# Patient Record
Sex: Male | Born: 1990 | Race: Black or African American | Hispanic: No | Marital: Married | Smoking: Never smoker
Health system: Southern US, Community
[De-identification: ages and names within clinical notes are randomized; demographics above are authoritative.]

## PROBLEM LIST (undated history)

## (undated) HISTORY — PX: NO PAST SURGERIES: SHX2092

---
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
## 2010-07-05 IMAGING — CR DG FOOT COMPLETE 3+V*L*
3 series · 3 of 3 positions shown · non-contrast
Comparison: No priors

CLINICAL DATA: Injured the fifth toe of several days ago

LEFT FOOT - COMPLETE 3+ VIEW

[t foot ap left]
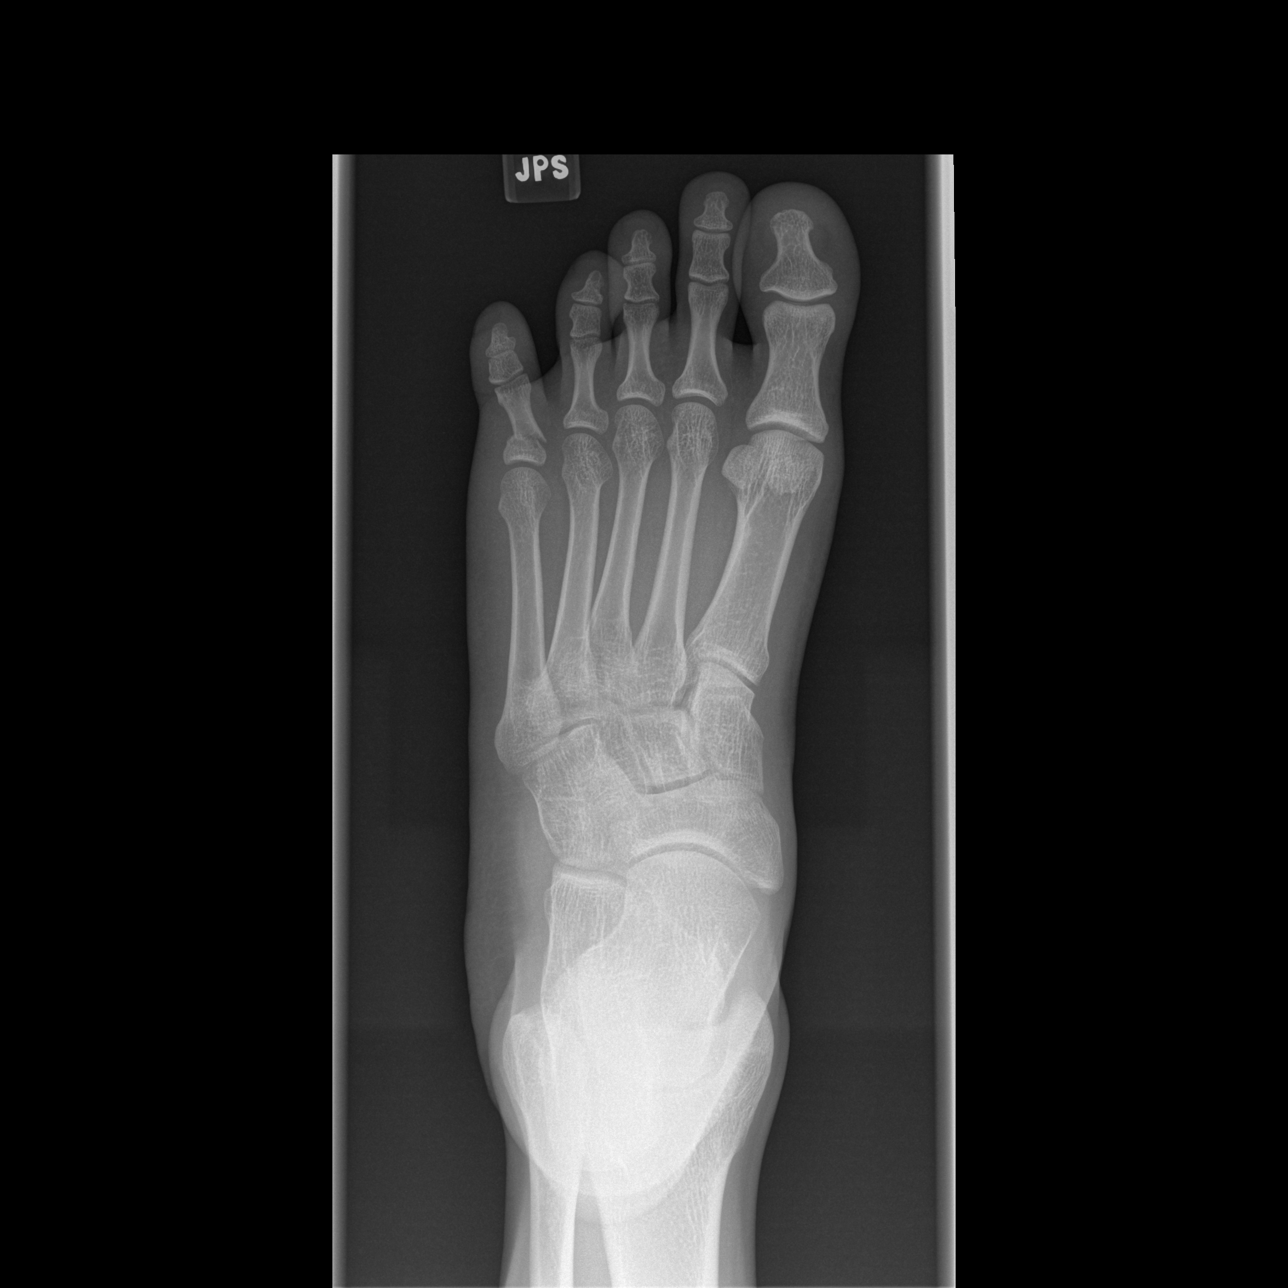

[t foot oblique left]
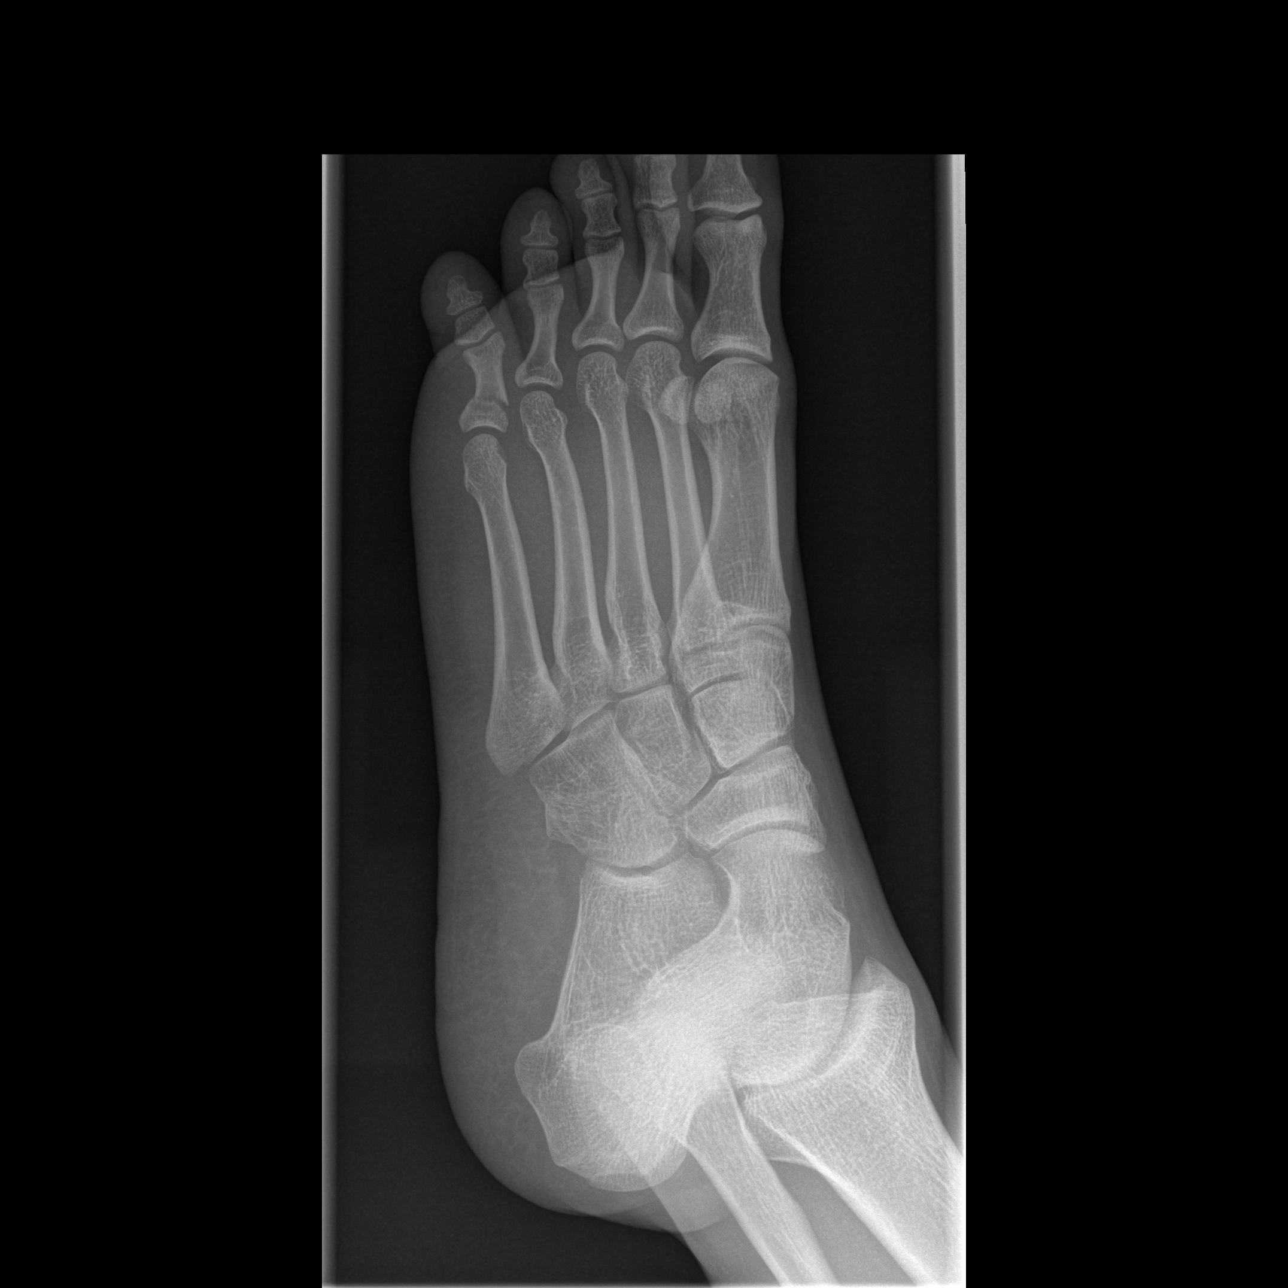

[t foot lat left]
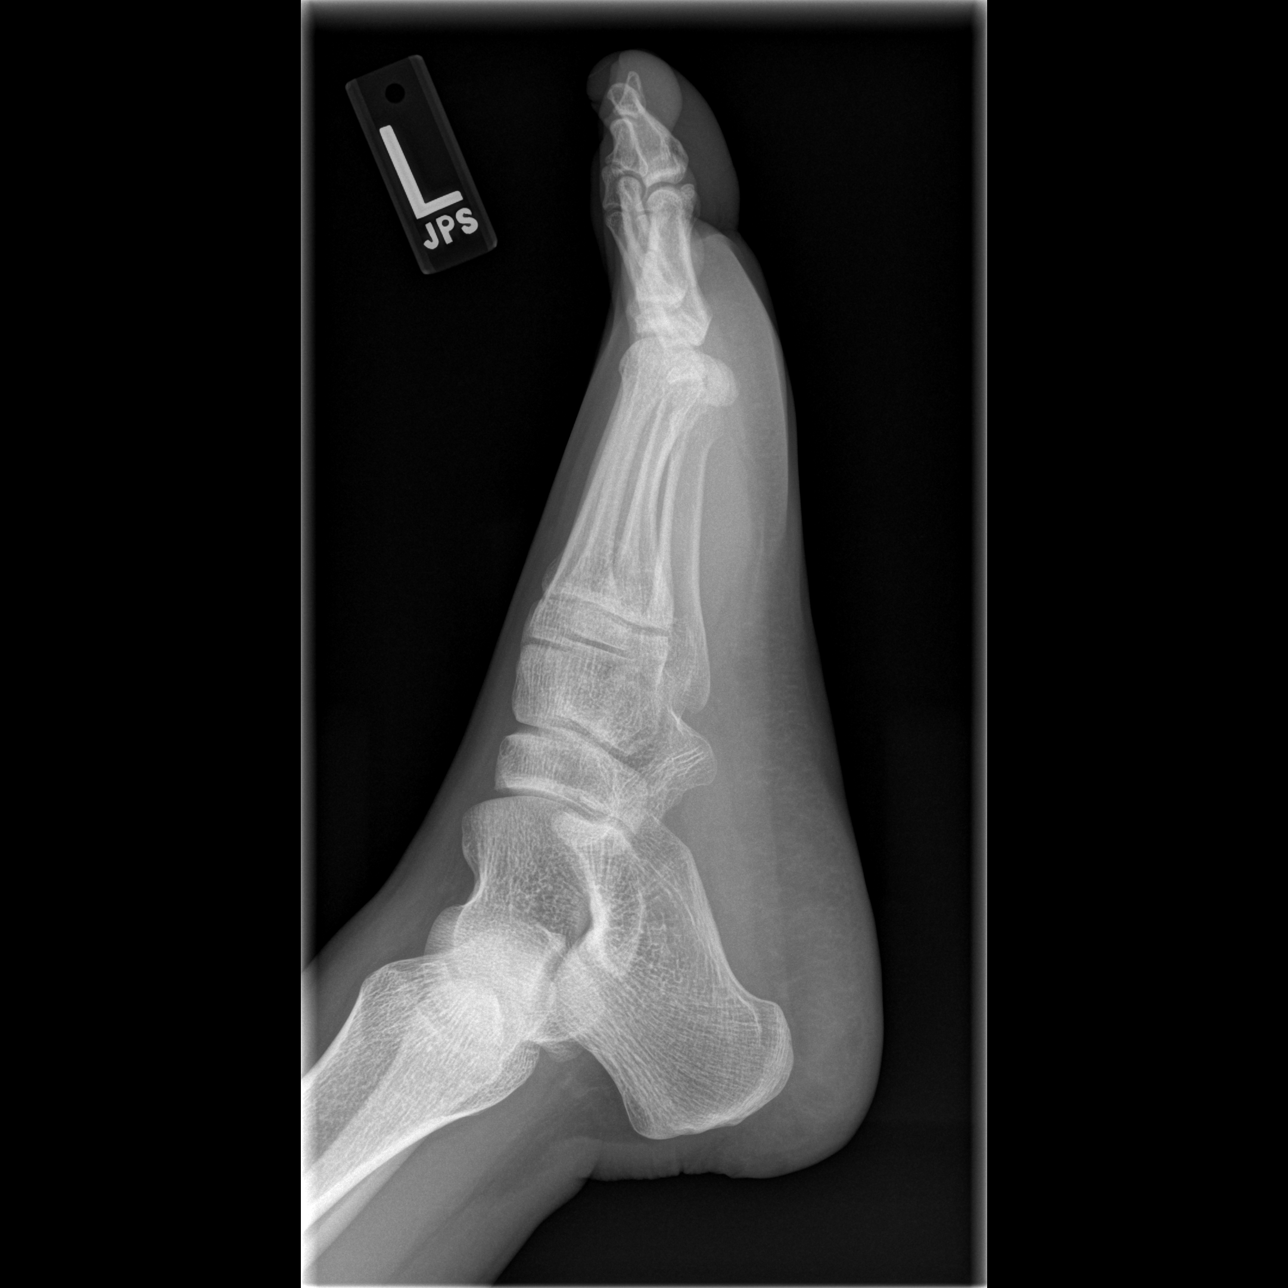

[3 of 3 positions shown; findings below may reference images not displayed]

FINDINGS: There is a mildly angulated transverse fracture through
the base of the proximal phalanx of the fifth digit.
IMPRESSION: Fracture of the fifth digit as above.

## 2015-02-10 ENCOUNTER — Telehealth: Payer: Self-pay

## 2015-02-15 NOTE — Telephone Encounter (Signed)
No msg °

## 2015-03-18 ENCOUNTER — Encounter (HOSPITAL_COMMUNITY): Payer: Self-pay | Admitting: *Deleted

## 2015-03-18 ENCOUNTER — Emergency Department (HOSPITAL_COMMUNITY)
Admission: EM | Admit: 2015-03-18 | Discharge: 2015-03-18 | Disposition: A | Discharge status: Home / Self Care | Payer: Self-pay | Attending: Emergency Medicine | Admitting: Emergency Medicine

## 2015-03-18 DIAGNOSIS — B9789 Other viral agents as the cause of diseases classified elsewhere: Secondary | ICD-10-CM

## 2015-03-18 DIAGNOSIS — J069 Acute upper respiratory infection, unspecified: Secondary | ICD-10-CM | POA: Insufficient documentation

## 2015-03-18 DIAGNOSIS — J988 Other specified respiratory disorders: Secondary | ICD-10-CM

## 2015-03-18 MED ORDER — IBUPROFEN 800 MG PO TABS: 800.0000 mg | ORAL_TABLET | Freq: Three times a day (TID) | ORAL | Status: DC | PRN

## 2015-03-18 MED ORDER — ALBUTEROL SULFATE HFA 108 (90 BASE) MCG/ACT IN AERS
2.0000 | INHALATION_SPRAY | Freq: Once | RESPIRATORY_TRACT | Status: AC
  Administered 2015-03-18: 2 via RESPIRATORY_TRACT
  Filled 2015-03-18: qty 6.7

## 2015-03-18 MED ORDER — ACETAMINOPHEN 500 MG PO TABS
1000.0000 mg | ORAL_TABLET | Freq: Once | ORAL | Status: AC
  Administered 2015-03-18: 1000 mg via ORAL
  Filled 2015-03-18: qty 2

## 2015-03-18 MED ORDER — HYDROCOD POLST-CHLORPHEN POLST 10-8 MG/5ML PO LQCR: 5.0000 mL | Freq: Two times a day (BID) | ORAL | Status: DC | PRN

## 2015-03-18 NOTE — ED Notes (Signed)
Pt reports he has had HA, general aches and chills for one week.

## 2015-03-18 NOTE — Discharge Instructions (Signed)
Read the information below.  Use the prescribed medication as directed.  Please discuss all new medications with your pharmacist.  You may return to the Emergency Department at any time for worsening condition or any new symptoms that concern you.    If you develop high fevers that do not resolve with tylenol or ibuprofen, you have difficulty swallowing or breathing, or you are unable to tolerate fluids by mouth, return to the ER for a recheck.    ° ° ° °Viral Infections °A viral infection can be caused by different types of viruses. Most viral infections are not serious and resolve on their own. However, some infections may cause severe symptoms and may lead to further complications. °SYMPTOMS °Viruses can frequently cause: °· Minor sore throat. °· Aches and pains. °· Headaches. °· Runny nose. °· Different types of rashes. °· Watery eyes. °· Tiredness. °· Cough. °· Loss of appetite. °· Gastrointestinal infections, resulting in nausea, vomiting, and diarrhea. °These symptoms do not respond to antibiotics because the infection is not caused by bacteria. However, you might catch a bacterial infection following the viral infection. This is sometimes called a "superinfection." Symptoms of such a bacterial infection may include: °· Worsening sore throat with pus and difficulty swallowing. °· Swollen neck glands. °· Chills and a high or persistent fever. °· Severe headache. °· Tenderness over the sinuses. °· Persistent overall ill feeling (malaise), muscle aches, and tiredness (fatigue). °· Persistent cough. °· Yellow, green, or brown mucus production with coughing. °HOME CARE INSTRUCTIONS  °· Only take over-the-counter or prescription medicines for pain, discomfort, diarrhea, or fever as directed by your caregiver. °· Drink enough water and fluids to keep your urine clear or pale yellow. Sports drinks can provide valuable electrolytes, sugars, and hydration. °· Get plenty of rest and maintain proper nutrition. Soups and  broths with crackers or rice are fine. °SEEK IMMEDIATE MEDICAL CARE IF:  °· You have severe headaches, shortness of breath, chest pain, neck pain, or an unusual rash. °· You have uncontrolled vomiting, diarrhea, or you are unable to keep down fluids. °· You or your child has an oral temperature above 102° F (38.9° C), not controlled by medicine. °· Your baby is older than 3 months with a rectal temperature of 102° F (38.9° C) or higher. °· Your baby is 3 months old or younger with a rectal temperature of 100.4° F (38° C) or higher. °MAKE SURE YOU:  °· Understand these instructions. °· Will watch your condition. °· Will get help right away if you are not doing well or get worse. °Document Released: 09/12/2005 Document Revised: 02/25/2012 Document Reviewed: 04/09/2011 °[employer]® Patient Information ©2015 [employer]. This information is not intended to replace advice given to you by your health care provider. Make sure you discuss any questions you have with your health care provider. ° °

## 2015-03-18 NOTE — ED Provider Notes (Signed)
CSN: [id number]     Arrival date & time 03/18/15  0806 History   First MD Initiated Contact with Patient 03/18/15 0809     Chief Complaint  Patient presents with  . Influenza     (Consider location/radiation/quality/duration/timing/severity/associated sxs/prior Treatment) The history is provided by the patient.     Patient presents with one weeks of chills, sweats, myalgias, headache, dry cough, mild sore throat, nasal congestion/rhinorrhea.  Has taken alka selzer cold and sinus without improvement.  Denies SOB.    History reviewed. No pertinent past medical history. History reviewed. No pertinent past surgical history. History reviewed. No pertinent family history. History  Substance Use Topics  . Smoking status: Never Smoker   . Smokeless tobacco: Never Used  . Alcohol Use: Yes     Comment: social    Review of Systems  All other systems reviewed and are negative.     Allergies  Review of patient's allergies indicates not on file.  Home Medications   Prior to Admission medications   Not on File   BP 108/75 mmHg  Pulse 100  Temp(Src) 101.7 F (38.7 C) (Oral)  Resp 20  SpO2 100% Physical Exam  Constitutional: He appears well-developed and well-nourished. No distress.  HENT:  Head: Normocephalic and atraumatic.  Mouth/Throat: Uvula is midline. Mucous membranes are not dry. Posterior oropharyngeal erythema present. No oropharyngeal exudate, posterior oropharyngeal edema or tonsillar abscesses.  Eyes: Conjunctivae and EOM are normal.  Neck: Normal range of motion. Neck supple.  Cardiovascular: Normal rate and regular rhythm.   Pulmonary/Chest: Effort normal and breath sounds normal. No respiratory distress. He has no wheezes. He has no rales.  Abdominal: Soft. He exhibits no distension and no mass. There is no tenderness. There is no rebound and no guarding.  Lymphadenopathy:    He has no cervical adenopathy.  Neurological: He is alert. He exhibits normal muscle  tone.  Skin: He is not diaphoretic.  Nursing note and vitals reviewed.   ED Course  Procedures (including critical care time) Labs Review Labs Reviewed - No data to display  Imaging Review No results found.   EKG Interpretation None      MDM   Final diagnoses:  Viral respiratory illness    Afebrile, nontoxic patient with constellation of symptoms suggestive of viral syndrome.  No concerning findings on exam.  Discharged home with supportive care, PCP follow up.  Discussed result, findings, treatment, and follow up  with patient.  Pt given return precautions.  Pt verbalizes understanding and agrees with plan.        Trixie Dredgemily Jaxten Brosh, PA-C 03/18/15 1052  Gwyneth SproutWhitney Plunkett, MD 03/19/15 [phone]

## 2015-03-18 NOTE — ED Notes (Signed)
Declined W/C at D/C and was escorted to lobby by RN. 

## 2020-12-30 ENCOUNTER — Other Ambulatory Visit: Payer: 59

## 2020-12-30 DIAGNOSIS — Z20822 Contact with and (suspected) exposure to covid-19: Secondary | ICD-10-CM

## 2021-01-01 LAB — NOVEL CORONAVIRUS, NAA: SARS-CoV-2, NAA: NOT DETECTED

## 2021-01-01 LAB — SARS-COV-2, NAA 2 DAY TAT

## 2022-07-25 ENCOUNTER — Ambulatory Visit (INDEPENDENT_AMBULATORY_CARE_PROVIDER_SITE_OTHER): Payer: 59 | Admitting: Nurse Practitioner

## 2022-07-25 ENCOUNTER — Encounter: Payer: Self-pay | Admitting: Nurse Practitioner

## 2022-07-25 VITALS — BP 110/76 | HR 91 | Temp 96.6°F | Ht 65.5 in | Wt 138.6 lb

## 2022-07-25 DIAGNOSIS — R202 Paresthesia of skin: Secondary | ICD-10-CM

## 2022-07-25 DIAGNOSIS — K219 Gastro-esophageal reflux disease without esophagitis: Secondary | ICD-10-CM | POA: Diagnosis not present

## 2022-07-25 DIAGNOSIS — Z13220 Encounter for screening for lipoid disorders: Secondary | ICD-10-CM | POA: Diagnosis not present

## 2022-07-25 DIAGNOSIS — R42 Dizziness and giddiness: Secondary | ICD-10-CM

## 2022-07-25 LAB — TSH: TSH: 1.1 u[IU]/mL (ref 0.35–5.50)

## 2022-07-25 LAB — CBC WITH DIFFERENTIAL/PLATELET
Basophils Absolute: 0 10*3/uL (ref 0.0–0.1)
Basophils Relative: 1 % (ref 0.0–3.0)
Eosinophils Absolute: 0.3 10*3/uL (ref 0.0–0.7)
Eosinophils Relative: 7.5 % — ABNORMAL HIGH (ref 0.0–5.0)
HCT: 43 % (ref 39.0–52.0)
Hemoglobin: 14.3 g/dL (ref 13.0–17.0)
Lymphocytes Relative: 34.5 % (ref 12.0–46.0)
Lymphs Abs: 1.2 10*3/uL (ref 0.7–4.0)
MCHC: 33.3 g/dL (ref 30.0–36.0)
MCV: 91.3 fl (ref 78.0–100.0)
Monocytes Absolute: 0.3 10*3/uL (ref 0.1–1.0)
Monocytes Relative: 8.4 % (ref 3.0–12.0)
Neutro Abs: 1.6 10*3/uL (ref 1.4–7.7)
Neutrophils Relative %: 48.6 % (ref 43.0–77.0)
Platelets: 218 10*3/uL (ref 150.0–400.0)
RBC: 4.72 Mil/uL (ref 4.22–5.81)
RDW: 13.6 % (ref 11.5–15.5)
WBC: 3.4 10*3/uL — ABNORMAL LOW (ref 4.0–10.5)

## 2022-07-25 LAB — COMPREHENSIVE METABOLIC PANEL
ALT: 13 U/L (ref 0–53)
AST: 19 U/L (ref 0–37)
Albumin: 4.8 g/dL (ref 3.5–5.2)
Alkaline Phosphatase: 39 U/L (ref 39–117)
BUN: 12 mg/dL (ref 6–23)
CO2: 25 mEq/L (ref 19–32)
Calcium: 9.3 mg/dL (ref 8.4–10.5)
Chloride: 102 mEq/L (ref 96–112)
Creatinine, Ser: 1.03 mg/dL (ref 0.40–1.50)
GFR: 97.22 mL/min (ref >60.00)
Glucose, Bld: 87 mg/dL (ref 70–99)
Potassium: 3.8 mEq/L (ref 3.5–5.1)
Sodium: 137 mEq/L (ref 135–145)
Total Bilirubin: 0.5 mg/dL (ref 0.2–1.2)
Total Protein: 7.6 g/dL (ref 6.0–8.3)

## 2022-07-25 LAB — LIPID PANEL
Cholesterol: 148 mg/dL (ref 0–200)
HDL: 59.2 mg/dL (ref >39.00)
LDL Cholesterol: 76 mg/dL (ref 0–99)
NonHDL: 89.1
Total CHOL/HDL Ratio: 3
Triglycerides: 66 mg/dL (ref 0.0–149.0)
VLDL: 13.2 mg/dL (ref 0.0–40.0)

## 2022-07-25 LAB — VITAMIN B12: Vitamin B-12: 283 pg/mL (ref 211–911)

## 2022-07-25 LAB — HEMOGLOBIN A1C: Hgb A1c MFr Bld: 5.8 % (ref 4.6–6.5)

## 2022-07-25 MED ORDER — OMEPRAZOLE 20 MG PO CPDR: 20.0000 mg | DELAYED_RELEASE_CAPSULE | Freq: Every day | ORAL | 1 refills | Status: DC

## 2022-07-25 NOTE — Progress Notes (Signed)
New Patient Visit  BP 110/76   Pulse 91   Temp (!) 96.6 F (35.9 C) (Temporal)   Ht 5' 5.5" (1.664 m)   Wt 138 lb 9.6 oz (62.9 kg)   SpO2 98%   BMI 22.71 kg/m    Subjective:    Patient ID: Mitchell Vega, male    DOB: 20-Sep-1991, 30 y.o.   MRN: [id number]  CC: Chief Complaint  Patient presents with   Establish Care    Np. Est care. Pt c/o persistent irritated throat w/ heart burn for several days. Pt states he has episodes of feeling foggy, dizzy and having out-of-body experience    HPI: Mitchell Vega is a 31 y.o. male presents for new patient visit to establish care.  Introduced to Publishing rights manager role and practice setting.  All questions answered.  Discussed provider/patient relationship and expectations.  He had a sore throat in May after being exposed to strep. He went to a doctor and tested negative for strep. It went away. He then developed a sore throat a few weeks ago and tested negative for strep again and when was prescribed an antibiotic for a sinus infection. He states that this has improved some. He also does endorse acid reflux, especially when eating spicy foods or pasta.  Right now, his throat feels fine.   3 years ago, he was driving and noticed that his body started tingling that went away on it's own. About a week ago, he was driving and noticed that he felt dizzy. He stopped and sat down for a minute. When he was driving home, he noticed that he felt he had delayed reactions, confusion. When he got home, he laid down and it went away. He does report some increase stress and his grandfather passed a week ago.   History reviewed. No pertinent past medical history.  History reviewed. No pertinent surgical history.  Family History  Problem Relation Age of Onset   Depression Mother    Hypertension Father    Heart disease Paternal Grandmother    Heart disease Paternal Grandfather      Social History   Tobacco Use   Smoking status: Never    Smokeless tobacco: Never  Vaping Use   Vaping Use: Never used  Substance Use Topics   Alcohol use: Yes    Comment: social   Drug use: No    No current outpatient medications on file prior to visit.   No current facility-administered medications on file prior to visit.     Review of Systems  Constitutional:  Positive for fatigue. Negative for fever.  HENT: Negative.    Respiratory:  Positive for shortness of breath (when laying down. He sleeps on his side).   Cardiovascular: Negative.   Gastrointestinal:  Negative for constipation and diarrhea.       Heart burn  Genitourinary: Negative.   Musculoskeletal: Negative.   Skin: Negative.   Neurological:  Positive for dizziness. Negative for headaches.  Psychiatric/Behavioral: Negative.        Objective:    BP 110/76   Pulse 91   Temp (!) 96.6 F (35.9 C) (Temporal)   Ht 5' 5.5" (1.664 m)   Wt 138 lb 9.6 oz (62.9 kg)   SpO2 98%   BMI 22.71 kg/m   Wt Readings from Last 3 Encounters:  07/25/22 138 lb 9.6 oz (62.9 kg)    BP Readings from Last 3 Encounters:  07/25/22 110/76  03/18/15 113/67    Physical Exam  Vitals and nursing note reviewed.  Constitutional:      Appearance: Normal appearance.  HENT:     Head: Normocephalic.     Right Ear: Tympanic membrane, ear canal and external ear normal.     Left Ear: Tympanic membrane, ear canal and external ear normal.     Mouth/Throat:     Pharynx: Oropharynx is clear. No oropharyngeal exudate or posterior oropharyngeal erythema.  Eyes:     Conjunctiva/sclera: Conjunctivae normal.  Cardiovascular:     Rate and Rhythm: Normal rate and regular rhythm.     Pulses: Normal pulses.     Heart sounds: Normal heart sounds.  Pulmonary:     Effort: Pulmonary effort is normal.     Breath sounds: Normal breath sounds.  Abdominal:     Palpations: Abdomen is soft.     Tenderness: There is no abdominal tenderness.  Musculoskeletal:     Cervical back: Normal range of motion and neck  supple. No tenderness.  Lymphadenopathy:     Cervical: No cervical adenopathy.  Skin:    General: Skin is warm.  Neurological:     General: No focal deficit present.     Mental Status: He is alert and oriented to person, place, and time.  Psychiatric:        Mood and Affect: Mood normal.        Behavior: Behavior normal.        Thought Content: Thought content normal.        Judgment: Judgment normal.        Assessment & Plan:   Problem List Items Addressed This Visit       Digestive   Gastroesophageal reflux disease - Primary    Intermittent sore throat may be related to GERD. Discussed nutritional changes, limiting triggers. Start omeprazole 20mg  daily. Follow-up in 4 weeks.       Relevant Medications   omeprazole (PRILOSEC) 20 MG capsule   Other Visit Diagnoses     Dizzy       May be related to stress. Will check CMP, CBC, and TSH. Encouraged drinking plenty of fluids. F/U 4 weeks    Relevant Orders   CBC with Differential/Platelet (Completed)   Comprehensive metabolic panel (Completed)   TSH (Completed)   Tingling       Will check vitamin B12 levels today. Vital signs stable. No red flags on exam.    Relevant Orders   Vitamin B12 (Completed)   Hemoglobin A1c (Completed)   Screening, lipid       Screen baseline lipid panel today   Relevant Orders   Lipid panel (Completed)        Follow up plan: Return in about 4 weeks (around 08/22/2022) for CPE.

## 2022-07-25 NOTE — Assessment & Plan Note (Signed)
Intermittent sore throat may be related to GERD. Discussed nutritional changes, limiting triggers. Start omeprazole 20mg  daily. Follow-up in 4 weeks.

## 2022-07-25 NOTE — Patient Instructions (Signed)
It was great to see you!  We are checking your labs today and will let you know the results via mychart/phone.   Start omeprazole once a day for acid reflux. You can take this daily as needed or every day.   Let's follow-up in 4 weeks, sooner if you have concerns.  If a referral was placed today, you will be contacted for an appointment. Please note that routine referrals can sometimes take up to 3-4 weeks to process. Please call our office if you haven't heard anything after this time frame.  Take care,  Rodman Pickle, NP

## 2022-08-28 NOTE — Progress Notes (Unsigned)
There were no vitals taken for this visit.   Subjective:    Patient ID: Mitchell Vega, male    DOB: [birth year], 31 y.o.   MRN: [id number]  CC: No chief complaint on file.  HPI: Mitchell Vega is a 31 y.o. male presenting on 08/29/2022 for comprehensive medical examination. Current medical complaints include:{Blank single:19197::"none","***"}  He currently lives with: Interim Problems from his last visit: {Blank single:19197::"yes","no"}  Depression Screen done today and results listed below:     07/25/2022    1:26 PM  Depression screen PHQ 2/9  Decreased Interest 1  Down, Depressed, Hopeless 2  PHQ - 2 Score 3  Altered sleeping 2  Tired, decreased energy 3  Change in appetite 0  Feeling bad or failure about yourself  2  Trouble concentrating 1  Moving slowly or fidgety/restless 0  Suicidal thoughts 0  PHQ-9 Score 11  Difficult doing work/chores Somewhat difficult    The patient {has/does not have:19849} a history of falls. I {did/did not:19850} complete a risk assessment for falls. A plan of care for falls {was/was not:19852} documented.   Past Medical History:  No past medical history on file.  Surgical History:  No past surgical history on file.  Medications:  Current Outpatient Medications on File Prior to Visit  Medication Sig   omeprazole (PRILOSEC) 20 MG capsule Take 1 capsule (20 mg total) by mouth daily.   No current facility-administered medications on file prior to visit.    Allergies:  No Known Allergies  Social History:  Social History   Socioeconomic History   Marital status: Married    Spouse name: Not on file   Number of children: Not on file   Years of education: Not on file   Highest education level: Not on file  Occupational History   Not on file  Tobacco Use   Smoking status: Never   Smokeless tobacco: Never  Vaping Use   Vaping Use: Never used  Substance and Sexual Activity   Alcohol use: Yes    Comment: social   Drug  use: No   Sexual activity: Yes  Other Topics Concern   Not on file  Social History Narrative   Not on file   Social Determinants of Health   Financial Resource Strain: Not on file  Food Insecurity: Not on file  Transportation Needs: Not on file  Physical Activity: Not on file  Stress: Not on file  Social Connections: Not on file  Intimate Partner Violence: Not on file   Social History   Tobacco Use  Smoking Status Never  Smokeless Tobacco Never   Social History   Substance and Sexual Activity  Alcohol Use Yes   Comment: social    Family History:  Family History  Problem Relation Age of Onset   Depression Mother    Hypertension Father    Heart disease Paternal Grandmother    Heart disease Paternal Grandfather     Past medical history, surgical history, medications, allergies, family history and social history reviewed with patient today and changes made to appropriate areas of the chart.   ROS All other ROS negative except what is listed above and in the HPI.      Objective:    There were no vitals taken for this visit.  Wt Readings from Last 3 Encounters:  07/25/22 138 lb 9.6 oz (62.9 kg)    Physical Exam  Results for orders placed or performed in visit on 07/25/22  CBC with Differential/Platelet  Result Value Ref Range   WBC 3.4 (L) 4.0 - 10.5 K/uL   RBC 4.72 4.22 - 5.81 Mil/uL   Hemoglobin 14.3 13.0 - 17.0 g/dL   HCT 54.6 56.8 - 12.7 %   MCV 91.3 78.0 - 100.0 fl   MCHC 33.3 30.0 - 36.0 g/dL   RDW 51.7 00.1 - 74.9 %   Platelets 218.0 150.0 - 400.0 K/uL   Neutrophils Relative % 48.6 43.0 - 77.0 %   Lymphocytes Relative 34.5 12.0 - 46.0 %   Monocytes Relative 8.4 3.0 - 12.0 %   Eosinophils Relative 7.5 (H) 0.0 - 5.0 %   Basophils Relative 1.0 0.0 - 3.0 %   Neutro Abs 1.6 1.4 - 7.7 K/uL   Lymphs Abs 1.2 0.7 - 4.0 K/uL   Monocytes Absolute 0.3 0.1 - 1.0 K/uL   Eosinophils Absolute 0.3 0.0 - 0.7 K/uL   Basophils Absolute 0.0 0.0 - 0.1 K/uL   Comprehensive metabolic panel  Result Value Ref Range   Sodium 137 135 - 145 mEq/L   Potassium 3.8 3.5 - 5.1 mEq/L   Chloride 102 96 - 112 mEq/L   CO2 25 19 - 32 mEq/L   Glucose, Bld 87 70 - 99 mg/dL   BUN 12 6 - 23 mg/dL   Creatinine, Ser 4.49 0.40 - 1.50 mg/dL   Total Bilirubin 0.5 0.2 - 1.2 mg/dL   Alkaline Phosphatase 39 39 - 117 U/L   AST 19 0 - 37 U/L   ALT 13 0 - 53 U/L   Total Protein 7.6 6.0 - 8.3 g/dL   Albumin 4.8 3.5 - 5.2 g/dL   GFR 67.59 >16.38 mL/min   Calcium 9.3 8.4 - 10.5 mg/dL  Lipid panel  Result Value Ref Range   Cholesterol 148 0 - 200 mg/dL   Triglycerides 46.6 0.0 - 149.0 mg/dL   HDL 59.93 >57.01 mg/dL   VLDL 77.9 0.0 - 39.0 mg/dL   LDL Cholesterol 76 0 - 99 mg/dL   Total CHOL/HDL Ratio 3    NonHDL 89.10   TSH  Result Value Ref Range   TSH 1.10 0.35 - 5.50 uIU/mL  Vitamin B12  Result Value Ref Range   Vitamin B-12 283 211 - 911 pg/mL  Hemoglobin A1c  Result Value Ref Range   Hgb A1c MFr Bld 5.8 4.6 - 6.5 %      Assessment & Plan:   Problem List Items Addressed This Visit   None    Discussed aspirin prophylaxis for myocardial infarction prevention and decision was {Blank single:19197::"it was not indicated","made to continue ASA","made to start ASA","made to stop ASA","that we recommended ASA, and patient refused"}  LABORATORY TESTING:  Health maintenance labs ordered today as discussed above.   The natural history of prostate cancer and ongoing controversy regarding screening and potential treatment outcomes of prostate cancer has been discussed with the patient. The meaning of a false positive PSA and a false negative PSA has been discussed. He indicates understanding of the limitations of this screening test and wishes *** to proceed with screening PSA testing.   IMMUNIZATIONS:   - Tdap: Tetanus vaccination status reviewed: {tetanus status:315746}. - Influenza: {Blank single:19197::"Up to date","Administered today","Postponed to flu  season","Refused","Given elsewhere"} - Pneumovax: {Blank single:19197::"Up to date","Administered today","Not applicable","Refused","Given elsewhere"} - Prevnar: {Blank single:19197::"Up to date","Administered today","Not applicable","Refused","Given elsewhere"} - HPV: {Blank single:19197::"Up to date","Administered today","Not applicable","Refused","Given elsewhere"} - Zostavax vaccine: {Blank single:19197::"Up to date","Administered today","Not applicable","Refused","Given elsewhere"}  SCREENING: - Colonoscopy: {Blank single:19197::"Up to date","Ordered today","Not applicable","Refused","Done  elsewhere"}  Discussed with patient purpose of the colonoscopy is to detect colon cancer at curable precancerous or early stages   - AAA Screening: {Blank single:19197::"Up to date","Ordered today","Not applicable","Refused","Done elsewhere"}  -Hearing Test: {Blank single:19197::"Up to date","Ordered today","Not applicable","Refused","Done elsewhere"}  -Spirometry: {Blank single:19197::"Up to date","Ordered today","Not applicable","Refused","Done elsewhere"}   PATIENT COUNSELING:    Sexuality: Discussed sexually transmitted diseases, partner selection, use of condoms, avoidance of unintended pregnancy  and contraceptive alternatives.   Advised to avoid cigarette smoking.  I discussed with the patient that most people either abstain from alcohol or drink within safe limits (<=14/week and <=4 drinks/occasion for males, <=7/weeks and <= 3 drinks/occasion for females) and that the risk for alcohol disorders and other health effects rises proportionally with the number of drinks per week and how often a drinker exceeds daily limits.  Discussed cessation/primary prevention of drug use and availability of treatment for abuse.   Diet: Encouraged to adjust caloric intake to maintain  or achieve ideal body weight, to reduce intake of dietary saturated fat and total fat, to limit sodium intake by avoiding high  sodium foods and not adding table salt, and to maintain adequate dietary potassium and calcium preferably from fresh fruits, vegetables, and low-fat dairy products.    stressed the importance of regular exercise  Injury prevention: Discussed safety belts, safety helmets, smoke detector, smoking near bedding or upholstery.   Dental health: Discussed importance of regular tooth brushing, flossing, and dental visits.   Follow up plan: NEXT PREVENTATIVE PHYSICAL DUE IN 1 YEAR. No follow-ups on file.

## 2022-08-29 ENCOUNTER — Encounter: Payer: Self-pay | Admitting: Nurse Practitioner

## 2022-08-29 ENCOUNTER — Ambulatory Visit (INDEPENDENT_AMBULATORY_CARE_PROVIDER_SITE_OTHER): Payer: 59 | Admitting: Nurse Practitioner

## 2022-08-29 VITALS — BP 102/80 | HR 71 | Temp 97.2°F | Wt 136.6 lb

## 2022-08-29 DIAGNOSIS — K219 Gastro-esophageal reflux disease without esophagitis: Secondary | ICD-10-CM | POA: Diagnosis not present

## 2022-08-29 DIAGNOSIS — R7303 Prediabetes: Secondary | ICD-10-CM | POA: Insufficient documentation

## 2022-08-29 DIAGNOSIS — Z0000 Encounter for general adult medical examination without abnormal findings: Secondary | ICD-10-CM | POA: Diagnosis not present

## 2022-08-29 NOTE — Assessment & Plan Note (Signed)
Chronic, stable. Symptoms are well controlled on omeprazole 20mg  daily. He can continue taking this medication or trial coming off and seeing if his symptoms re-appear. He has been trying to limit triggering foods. Follow-up if symptoms worsen or with any concerns.

## 2022-08-29 NOTE — Assessment & Plan Note (Signed)
Discussed nutrition, exercise. Check A1c yearly unless symptoms occur before then.

## 2022-08-29 NOTE — Patient Instructions (Signed)
It was great to see you!  You can trial stopping the prilosec to see how your symptoms do, or you can keep taking it. Call your pharmacy when you are close to running out to get a refill.   Let's follow-up in 1 year, sooner if you have concerns.  If a referral was placed today, you will be contacted for an appointment. Please note that routine referrals can sometimes take up to 3-4 weeks to process. Please call our office if you haven't heard anything after this time frame.  Take care,  Rodman Pickle, NP

## 2022-09-11 ENCOUNTER — Encounter: Payer: Self-pay | Admitting: Emergency Medicine

## 2022-09-11 DIAGNOSIS — R258 Other abnormal involuntary movements: Secondary | ICD-10-CM | POA: Diagnosis present

## 2022-09-11 NOTE — ED Triage Notes (Signed)
Pt arrived via [facility] from home with c/o pseudoseizure activity x2 months. Per EMS, when seizures occur, pts eyes twitch and roll backwards. While pt still on stretcher pt presents with eye movements. MD Karma Greaser to stretcher with sternal rub that immediately stops activity and pt smiling and verbally responding to MD's questions.

## 2022-09-11 NOTE — ED Provider Triage Note (Signed)
Emergency Medicine Provider Triage Evaluation Note  Mitchell Vega , a 31 y.o. male  was evaluated in triage.  Pt complains of seizure like activity for several months.  Review of Systems  Positive: fluttering eyes, fatigue Negative: chest pain, SOB, abd pain  Physical Exam  There were no vitals taken for this visit. Gen:   Awake, no distress.  Patient's eyelids began fluttering rapidly while he was on the EMS stretcher, which stopped abruptly upon sternal rub. Resp:  Normal effort .  No increased respiratory effort, no accessory muscle usage. MSK:   Moves extremities without difficulty.  Other:  After sternal rub, eyelid fluttering ceased quickly.  Patient opened his eyes and spoke with me clearly.  He said that he just feels tired "like I cannot keep my eyes open".  Medical Decision Making  Medically screening exam initiated at 11:20 PM.  Appropriate orders placed.  Mustapha A Zink was informed that the remainder of the evaluation will be completed by another provider, this initial triage assessment does not replace that evaluation, and the importance of remaining in the ED until their evaluation is complete.  Behavior and movements seen as part of PIT process do not appear consistent with seizure activity, appears to be nonepileptiform or voluntary behavior.  No evidence that the patient is emergently ill or needs a bed immediately.  I ordered salicylate level, acetaminophen level, BMP, ethanol level, CBC with differential, urine drug screen, all of which should help to rule out emergent medical condition as well as to provide insight into the possibility of intoxication or medication side effects as a cause of his symptoms.   Hinda Kehr, MD 09/11/22 [phone]

## 2022-09-11 NOTE — ED Triage Notes (Signed)
EMS brings pt in from home; st last several mos has been having ?seizures;st arrived to find pt with eyes closed & fluttering but opens with sternal rub

## 2022-09-12 ENCOUNTER — Emergency Department
Admission: EM | Admit: 2022-09-12 | Discharge: 2022-09-12 | Disposition: A | Discharge status: Home / Self Care | Payer: 59 | Attending: Emergency Medicine | Admitting: Emergency Medicine

## 2022-09-12 ENCOUNTER — Telehealth: Payer: Self-pay | Admitting: Nurse Practitioner

## 2022-09-12 DIAGNOSIS — R569 Unspecified convulsions: Secondary | ICD-10-CM

## 2022-09-12 LAB — URINE DRUG SCREEN, QUALITATIVE (ARMC ONLY)
Amphetamines, Ur Screen: NOT DETECTED
Barbiturates, Ur Screen: NOT DETECTED
Benzodiazepine, Ur Scrn: NOT DETECTED
Cannabinoid 50 Ng, Ur ~~LOC~~: NOT DETECTED
Cocaine Metabolite,Ur ~~LOC~~: NOT DETECTED
MDMA (Ecstasy)Ur Screen: NOT DETECTED
Methadone Scn, Ur: NOT DETECTED
Opiate, Ur Screen: NOT DETECTED
Phencyclidine (PCP) Ur S: NOT DETECTED
Tricyclic, Ur Screen: NOT DETECTED

## 2022-09-12 LAB — BASIC METABOLIC PANEL
Anion gap: 7 (ref 5–15)
BUN: 20 mg/dL (ref 6–20)
CO2: 25 mmol/L (ref 22–32)
Calcium: 8.8 mg/dL — ABNORMAL LOW (ref 8.9–10.3)
Chloride: 106 mmol/L (ref 98–111)
Creatinine, Ser: 1.01 mg/dL (ref 0.61–1.24)
GFR, Estimated: >60 mL/min (ref >60)
Glucose, Bld: 109 mg/dL — ABNORMAL HIGH (ref 70–99)
Potassium: 4.3 mmol/L (ref 3.5–5.1)
Sodium: 138 mmol/L (ref 135–145)

## 2022-09-12 LAB — CBC WITH DIFFERENTIAL/PLATELET
Abs Immature Granulocytes: 0 10*3/uL (ref 0.00–0.07)
Basophils Absolute: 0 10*3/uL (ref 0.0–0.1)
Basophils Relative: 1 %
Eosinophils Absolute: 0.3 10*3/uL (ref 0.0–0.5)
Eosinophils Relative: 7 %
HCT: 38.8 % — ABNORMAL LOW (ref 39.0–52.0)
Hemoglobin: 12.5 g/dL — ABNORMAL LOW (ref 13.0–17.0)
Immature Granulocytes: 0 %
Lymphocytes Relative: 33 %
Lymphs Abs: 1.3 10*3/uL (ref 0.7–4.0)
MCH: 29.9 pg (ref 26.0–34.0)
MCHC: 32.2 g/dL (ref 30.0–36.0)
MCV: 92.8 fL (ref 80.0–100.0)
Monocytes Absolute: 0.4 10*3/uL (ref 0.1–1.0)
Monocytes Relative: 10 %
Neutro Abs: 1.9 10*3/uL (ref 1.7–7.7)
Neutrophils Relative %: 49 %
Platelets: 189 10*3/uL (ref 150–400)
RBC: 4.18 MIL/uL — ABNORMAL LOW (ref 4.22–5.81)
RDW: 12.3 % (ref 11.5–15.5)
WBC: 3.8 10*3/uL — ABNORMAL LOW (ref 4.0–10.5)
nRBC: 0 % (ref 0.0–0.2)

## 2022-09-12 LAB — ETHANOL: Alcohol, Ethyl (B): <10 mg/dL (ref <10)

## 2022-09-12 LAB — SALICYLATE LEVEL: Salicylate Lvl: <7 mg/dL — ABNORMAL LOW (ref 7.0–30.0)

## 2022-09-12 LAB — ACETAMINOPHEN LEVEL: Acetaminophen (Tylenol), Serum: <10 ug/mL — ABNORMAL LOW (ref 10–30)

## 2022-09-12 NOTE — Telephone Encounter (Signed)
Caller Name: Siraj Dermody Call back phone #: [phone]  Reason for Call: Just left [facility] due to husband's seizures. They told him to schedule an appointment with neurology but never gave him a referral. Can Lauren send this is for them?

## 2022-09-12 NOTE — Discharge Instructions (Signed)
Call Dr. Quinn Axe (neurology) for an appointment this week.   Thank you for choosing [place] for your health care today!  Please see your primary doctor this week for a follow up appointment.   If you do not have a primary doctor call the following clinics to establish care:  If you have insurance:  [facility] [phone] [place] [address]   [facility]  [phone] [place] [address]   If you do not have insurance:  [facility]  [phone] [address], [place] [address]  Sometimes, in the early stages of certain disease courses it is difficult to detect in the emergency department evaluation -- so, it is important that you continue to monitor your symptoms and call your doctor right away or return to the emergency department if you develop any new or worsening symptoms.  It was my pleasure to care for you today.   Hoover Brunette Jacelyn Grip, MD

## 2022-09-12 NOTE — ED Provider Notes (Signed)
[facility] Provider Note    Event Date/Time   First MD Initiated Contact with Patient 09/12/22 0407     (approximate)   History   Seizures   HPI  JAIME GRIZZELL is a 31 y.o. male   Past medical history of Healthy young man who lives at home with his wife and child who presents with Seizure like activity for months starting in the early summer. His episodes are transient and returns back to baseline in between episodes, which happen several times per week.  No more than a few minutes at a time.  No loc No trauma  No meds or drugs etoh No other complaints , no recent illnesses.  No history of seizures.  He had 2 episodes in ED which were noted by Dr. York Cerise in triage and by myself in the emergency department consisted of eyelid fluttering, tightly closing his eyes, quickly cease upon either sternal rub or verbal stimulation and the patient has no postictal period, had no generalized tonic-clonic activity, no incontinence and is immediately back to his baseline mental status.  History was obtained via the patient      Physical Exam   Triage Vital Signs: ED Triage Vitals  Enc Vitals Group     BP 09/11/22 2347 115/78     Pulse Rate 09/11/22 2347 81     Resp 09/11/22 2347 20     Temp 09/11/22 2347 98.9 F (37.2 C)     Temp Source 09/11/22 2347 Oral     SpO2 09/11/22 2347 98 %     Weight 09/11/22 2348 139 lb (63 kg)     Height 09/11/22 2348 5' 5.5" (1.664 m)     Head Circumference --      Peak Flow --      Pain Score 09/11/22 2348 0     Pain Loc --      Pain Edu? --      Excl. in GC? --     Most recent vital signs: Vitals:   09/11/22 2347 09/12/22 0623  BP: 115/78 103/73  Pulse: 81 69  Resp: 20 19  Temp: 98.9 F (37.2 C) 98 F (36.7 C)  SpO2: 98% 97%    General: Awake, no distress.  Alert and oriented pleasant and cooperative CV:  Good peripheral perfusion.  Resp:  Normal effort.  Abd:  No distention.  Nontender to  palpation. Other:  No focal neurologic deficits including normal motor or sensory, coordination, no facial asymmetry, no cranial nerve deficits, no visual cuts and no signs of trauma.   ED Results / Procedures / Treatments   Labs (all labs ordered are listed, but only abnormal results are displayed) Labs Reviewed  BASIC METABOLIC PANEL - Abnormal; Notable for the following components:      Result Value   Glucose, Bld 109 (*)    Calcium 8.8 (*)    All other components within normal limits  CBC WITH DIFFERENTIAL/PLATELET - Abnormal; Notable for the following components:   WBC 3.8 (*)    RBC 4.18 (*)    Hemoglobin 12.5 (*)    HCT 38.8 (*)    All other components within normal limits  ACETAMINOPHEN LEVEL - Abnormal; Notable for the following components:   Acetaminophen (Tylenol), Serum <10 (*)    All other components within normal limits  SALICYLATE LEVEL - Abnormal; Notable for the following components:   Salicylate Lvl <7.0 (*)    All other components within normal limits  ETHANOL  URINE DRUG SCREEN, QUALITATIVE ([facility] ONLY)     I reviewed labs and they are notable for normal glucose 109,   PROCEDURES:  Critical Care performed: No  Procedures   MEDICATIONS ORDERED IN ED: Medications - No data to display  IMPRESSION / MDM / [place] / ED COURSE  I reviewed the triage vital signs and the nursing notes.                              Differential diagnosis includes, but is not limited to, seizures less likely given episodes witnessed in the emergency department and described as above, psychogenic nonepileptic seizures, evaluate for other underlying metabolic derangements, toxic ingestions, hypoglycemia.    MDM: Given chronicity of these episodes and no other medical complaints, normal exam, and very low clinical suspicion of seizures, will evaluate lab testing as above and discharged with neurology referral if evaluation unremarkable today.  6:57 AM W/u  unremarkable.  Reassessed at this time and is in stable condition with no complaints, alert oriented and agreeable to plan for discharge and neurology follow-up  Patient's presentation is most consistent with acute presentation with potential threat to life or bodily function.       FINAL CLINICAL IMPRESSION(S) / ED DIAGNOSES   Final diagnoses:  Seizure-like activity ([place])     Rx / DC Orders   ED Discharge Orders     None        Note:  This document was prepared using Dragon voice recognition software and may include unintentional dictation errors.    Lucillie Garfinkel, MD 09/12/22 [phone]

## 2022-09-12 NOTE — ED Notes (Signed)
D/C and reasons to return discussed with pt and wife. All questions for f/up answered for pt and wife. Pt told not to drive at this time until f/up with neuro.

## 2022-09-13 NOTE — Telephone Encounter (Signed)
Called x2 and ldvm. Pt needs to schedule a hosp f/u visit with Lauren. Sw, cma

## 2022-09-19 ENCOUNTER — Encounter: Payer: Self-pay | Admitting: Neurology

## 2022-09-19 ENCOUNTER — Ambulatory Visit (INDEPENDENT_AMBULATORY_CARE_PROVIDER_SITE_OTHER): Payer: 59 | Admitting: Neurology

## 2022-09-19 VITALS — BP 122/71 | HR 84 | Ht 65.5 in | Wt 133.8 lb

## 2022-09-19 DIAGNOSIS — R569 Unspecified convulsions: Secondary | ICD-10-CM

## 2022-09-19 NOTE — Progress Notes (Signed)
[facility]  PATIENT: Mitchell Vega DOB: 03-Mar-1991  REQUESTING CLINICIAN: Gerre Scull, NP HISTORY FROM: Patient and spouse Alana  REASON FOR VISIT: Seizure like activity    HISTORICAL  CHIEF COMPLAINT:  Chief Complaint  Patient presents with   New Patient (Initial Visit)    RM 13 w/ spouse, Alana. Internal referral for seizures. He was at [facility] 09/12/22( Tuesday night) for seizure like activity. Prior to going, he was sitting on the couch late at night/texting and watching TV. Could not comprehend what he was reading. Got up to walk and kept falling. Tried to lay down but sx worsened. Could not speak. EMS called and brought him to the hospital. Labs ok at hospital. Has had these episodes in the past. No EEG done. Sister has seizures.     HISTORY OF PRESENT ILLNESS:  This is a 31 year old gentleman with past medical history of seasonal allergy who is presenting with seizure-like activity.  Patient reports the seizure-like activity starting over the summer.  Initially he was having feeling of lightheadedness, feeling of out of body experience, losing consciousness, feel like he did have mental awareness, even at times to drive.  He reported a month later he felt the same, went to see his primary care doctor and was told that everything is normal.  On September 26 he was at church and he had an event that he could not move his eyes.  The following Tuesday he was texting but could not understand the word that he was reading.  He then got up started to walk and then fell, he lost the ability to speak.  He felt like sound and light were irritable to him.  He reported eyes rolling sensation.  He was taken to the ED but per chart review a sternal rub aborted the event and this was felt to be nonepileptic.  Since discharge from the hospital, patient reported having eye rolling episodes daily.  During this time he is conscious, he just cannot control his eyes.   He denies any injury, denies any tongue biting and urinary incontinence, denies any generalized convulsion.  Wife reported that these events are brought on by stress.  Every time that he is stressed, he might have these events.   Handedness: left handed   Onset: Over the summer but getting worse  Seizure Type: eyes rolled back, unresponsive but conscious   Current frequency: Weekly   Any injuries from seizures: None   Seizure risk factors: None reported   Previous ASMs: None   Currenty ASMs: None   ASMs side effects: N/A   Brain Images: None   Previous EEGs: none    OTHER MEDICAL CONDITIONS: Seasonal allergies   REVIEW OF SYSTEMS: Full 14 system review of systems performed and negative with exception of: As noted in the HPI  ALLERGIES: No Known Allergies  HOME MEDICATIONS: Outpatient Medications Prior to Visit  Medication Sig Dispense Refill   loratadine (CLARITIN) 10 MG tablet Take 10 mg by mouth daily.     omeprazole (PRILOSEC) 20 MG capsule Take 1 capsule (20 mg total) by mouth daily. 90 capsule 1   No facility-administered medications prior to visit.    PAST MEDICAL HISTORY: History reviewed. No pertinent past medical history.  PAST SURGICAL HISTORY: Past Surgical History:  Procedure Laterality Date   NO PAST SURGERIES      FAMILY HISTORY: Family History  Problem Relation Age of Onset   Depression Mother    Hypertension Father  Heart disease Paternal Grandmother    Heart disease Paternal Grandfather     SOCIAL HISTORY: Social History   Socioeconomic History   Marital status: Married    Spouse name: Not on file   Number of children: Not on file   Years of education: Some college   Highest education level: Not on file  Occupational History   Not on file  Tobacco Use   Smoking status: Never   Smokeless tobacco: Never  Vaping Use   Vaping Use: Never used  Substance and Sexual Activity   Alcohol use: Yes    Comment: social   Drug use: No    Sexual activity: Yes  Other Topics Concern   Not on file  Social History Narrative   Left handed   Caffeine use: soda/coffee sometimes    Social Determinants of Health   Financial Resource Strain: Not on file  Food Insecurity: Not on file  Transportation Needs: Not on file  Physical Activity: Not on file  Stress: Not on file  Social Connections: Not on file  Intimate Partner Violence: Not on file   PHYSICAL EXAM  GENERAL EXAM/CONSTITUTIONAL: Vitals:  Vitals:   09/19/22 0827  BP: 122/71  Pulse: 84  Weight: 133 lb 12.8 oz (60.7 kg)  Height: 5' 5.5" (1.664 m)   Body mass index is 21.93 kg/m. Wt Readings from Last 3 Encounters:  09/19/22 133 lb 12.8 oz (60.7 kg)  09/11/22 139 lb (63 kg)  08/29/22 136 lb 9.6 oz (62 kg)   Patient is in no distress; well developed, nourished and groomed; neck is supple  EYES: Pupils round and reactive to light, Visual fields full to confrontation, Extraocular movements intacts,  No results found.  MUSCULOSKELETAL: Gait, strength, tone, movements noted in Neurologic exam below  NEUROLOGIC: MENTAL STATUS:      No data to display         awake, alert, oriented to person, place and time recent and remote memory intact normal attention and concentration language fluent, comprehension intact, naming intact fund of knowledge appropriate  CRANIAL NERVE:  2nd, 3rd, 4th, 6th - pupils equal and reactive to light, visual fields full to confrontation, extraocular muscles intact, no nystagmus 5th - facial sensation symmetric 7th - facial strength symmetric 8th - hearing intact 9th - palate elevates symmetrically, uvula midline 11th - shoulder shrug symmetric 12th - tongue protrusion midline  MOTOR:  normal bulk and tone, full strength in the BUE, BLE  SENSORY:  normal and symmetric to light touch  COORDINATION:  finger-nose-finger, fine finger movements normal  REFLEXES:  deep tendon reflexes present and  symmetric  GAIT/STATION:  normal   DIAGNOSTIC DATA (LABS, IMAGING, TESTING) - I reviewed patient records, labs, notes, testing and imaging myself where available.  Lab Results  Component Value Date   WBC 3.8 (L) 09/11/2022   HGB 12.5 (L) 09/11/2022   HCT 38.8 (L) 09/11/2022   MCV 92.8 09/11/2022   PLT 189 09/11/2022      Component Value Date/Time   NA 138 09/11/2022 2349   K 4.3 09/11/2022 2349   CL 106 09/11/2022 2349   CO2 25 09/11/2022 2349   GLUCOSE 109 (H) 09/11/2022 2349   BUN 20 09/11/2022 2349   CREATININE 1.01 09/11/2022 2349   CALCIUM 8.8 (L) 09/11/2022 2349   PROT 7.6 07/25/2022 1123   ALBUMIN 4.8 07/25/2022 1123   AST 19 07/25/2022 1123   ALT 13 07/25/2022 1123   ALKPHOS 39 07/25/2022 1123   BILITOT 0.5  07/25/2022 1123   GFRNONAA >60 09/11/2022 2349   Lab Results  Component Value Date   CHOL 148 07/25/2022   HDL 59.20 07/25/2022   LDLCALC 76 07/25/2022   TRIG 66.0 07/25/2022   Lab Results  Component Value Date   HGBA1C 5.8 07/25/2022   Lab Results  Component Value Date   VITAMINB12 283 07/25/2022   Lab Results  Component Value Date   TSH 1.10 07/25/2022     ASSESSMENT AND PLAN  31 y.o. year old male  with history of seasonal allergy who is presenting with events concerning for nonepileptic spells.  I will start by getting a routine EEG and if normal then will consider ambulatory EEG.  Per chart review patient was having an event in the ED which was aborted by sternal rub. No injury, no post ictal period and patient reports that he is conscious during the episode. If these events are deemed to be nonepileptic then will consider referral to psychiatry. This was explained to patient and spouse and they are agreeable with plans.    1. Seizure-like activity (HCC)     Patient Instructions  Routine EEG, I will contact you to go over the result, if normal and you continue to have these events, will order an ambulatory EEG.  Continue to follow with  PCP Return sooner if worse   Per [facility] statutes, patients with seizures are not allowed to drive until they have been seizure-free for six months.  Other recommendations include using caution when using heavy equipment or power tools. Avoid working on ladders or at heights. Take showers instead of baths.  Do not swim alone.  Ensure the water temperature is not too high on the home water heater. Do not go swimming alone. Do not lock yourself in a room alone (i.e. bathroom). When caring for infants or small children, sit down when holding, feeding, or changing them to minimize risk of injury to the child in the event you have a seizure. Maintain good sleep hygiene. Avoid alcohol.  Also recommend adequate sleep, hydration, good diet and minimize stress.   During the Seizure  - First, ensure adequate ventilation and place patients on the floor on their left side  Loosen clothing around the neck and ensure the airway is patent. If the patient is clenching the teeth, do not force the mouth open with any object as this can cause severe damage - Remove all items from the surrounding that can be hazardous. The patient may be oblivious to what's happening and may not even know what he or she is doing. If the patient is confused and wandering, either gently guide him/her away and block access to outside areas - Reassure the individual and be comforting - Call 911. In most cases, the seizure ends before EMS arrives. However, there are cases when seizures may last over 3 to 5 minutes. Or the individual may have developed breathing difficulties or severe injuries. If a pregnant patient or a person with diabetes develops a seizure, it is prudent to call an ambulance. - Finally, if the patient does not regain full consciousness, then call EMS. Most patients will remain confused for about 45 to 90 minutes after a seizure, so you must use judgment in calling for help. - Avoid restraints but make sure the  patient is in a bed with padded side rails - Place the individual in a lateral position with the neck slightly flexed; this will help the saliva drain from the mouth  and prevent the tongue from falling backward - Remove all nearby furniture and other hazards from the area - Provide verbal assurance as the individual is regaining consciousness - Provide the patient with privacy if possible - Call for help and start treatment as ordered by the caregiver   After the Seizure (Postictal Stage)  After a seizure, most patients experience confusion, fatigue, muscle pain and/or a headache. Thus, one should permit the individual to sleep. For the next few days, reassurance is essential. Being calm and helping reorient the person is also of importance.  Most seizures are painless and end spontaneously. Seizures are not harmful to others but can lead to complications such as stress on the lungs, brain and the heart. Individuals with prior lung problems may develop labored breathing and respiratory distress.     Orders Placed This Encounter  Procedures   EEG adult    No orders of the defined types were placed in this encounter.   Return in about 3 months (around 12/20/2022).  I have spent a total of 47 minutes dedicated to this patient today, preparing to see patient, performing a medically appropriate examination and evaluation, ordering tests and/or medications and procedures, and counseling and educating the patient/family/caregiver; independently interpreting result and communicating results to the family/patient/caregiver; and documenting clinical information in the electronic medical record.   Alric Ran, MD 09/19/2022, 10:37 AM  [facility] [address] [place], [place] [address] [phone]

## 2022-09-19 NOTE — Patient Instructions (Signed)
Routine EEG, I will contact you to go over the result, if normal and you continue to have these events, will order an ambulatory EEG.  Continue to follow with PCP Return sooner if worse

## 2022-09-20 ENCOUNTER — Encounter: Payer: Self-pay | Admitting: Neurology

## 2022-09-20 ENCOUNTER — Ambulatory Visit (INDEPENDENT_AMBULATORY_CARE_PROVIDER_SITE_OTHER): Payer: 59 | Admitting: Neurology

## 2022-09-20 DIAGNOSIS — R569 Unspecified convulsions: Secondary | ICD-10-CM | POA: Diagnosis not present

## 2022-09-20 NOTE — Procedures (Signed)
    History:  31 year old man with seizure like activity   EEG classification: Awake and drowsy  Description of the recording: The background rhythms of this recording consists of a fairly well modulated medium amplitude alpha rhythm of 10 Hz that is reactive to eye opening and closure. Present in the anterior head region is a 15-20 Hz beta activity. Photic stimulation was performed, did not show any abnormalities. Hyperventilation was also performed, did not show any abnormalities. Drowsiness was manifested by background fragmentation. No abnormal epileptiform discharges seen during this recording. There was no focal slowing. There were no electrographic seizure identified. EKG monitor shows no evidence of cardiac rhythm abnormalities with a heart rate of 72.  Abnormality: None   Impression: This is a normal EEG recorded while drowsy and awake. No evidence of interictal epileptiform discharges. Normal EEGs, however, do not rule out epilepsy.    Alric Ran, MD [facility]

## 2022-09-21 ENCOUNTER — Inpatient Hospital Stay: Payer: 59 | Admitting: Nurse Practitioner

## 2022-09-22 ENCOUNTER — Encounter: Payer: Self-pay | Admitting: Nurse Practitioner

## 2022-09-24 ENCOUNTER — Telehealth: Payer: Self-pay

## 2022-09-24 NOTE — Telephone Encounter (Signed)
Called Absence request department and informed rep extension for completion of forms is needed due to the time the office received the forms from the patient. Per rep, she will send in a extension request past the due date of 09/25/22 since patient appt isn't until 09/26/22.

## 2022-09-25 NOTE — Telephone Encounter (Signed)
Noted, will follow-up at scheduled appointment.

## 2022-09-25 NOTE — Telephone Encounter (Signed)
FYI message below.

## 2022-09-26 ENCOUNTER — Ambulatory Visit (INDEPENDENT_AMBULATORY_CARE_PROVIDER_SITE_OTHER): Payer: 59 | Admitting: Nurse Practitioner

## 2022-09-26 ENCOUNTER — Encounter: Payer: Self-pay | Admitting: Nurse Practitioner

## 2022-09-26 VITALS — BP 102/68 | HR 100 | Temp 97.9°F | Wt 135.0 lb

## 2022-09-26 DIAGNOSIS — R569 Unspecified convulsions: Secondary | ICD-10-CM | POA: Diagnosis not present

## 2022-09-26 DIAGNOSIS — Z1159 Encounter for screening for other viral diseases: Secondary | ICD-10-CM | POA: Diagnosis not present

## 2022-09-26 DIAGNOSIS — R42 Dizziness and giddiness: Secondary | ICD-10-CM

## 2022-09-26 LAB — COMPREHENSIVE METABOLIC PANEL
ALT: 12 U/L (ref 0–53)
AST: 17 U/L (ref 0–37)
Albumin: 4.5 g/dL (ref 3.5–5.2)
Alkaline Phosphatase: 52 U/L (ref 39–117)
BUN: 13 mg/dL (ref 6–23)
CO2: 28 mEq/L (ref 19–32)
Calcium: 9.7 mg/dL (ref 8.4–10.5)
Chloride: 102 mEq/L (ref 96–112)
Creatinine, Ser: 1.08 mg/dL (ref 0.40–1.50)
GFR: 91.74 mL/min (ref >60.00)
Glucose, Bld: 85 mg/dL (ref 70–99)
Potassium: 4 mEq/L (ref 3.5–5.1)
Sodium: 139 mEq/L (ref 135–145)
Total Bilirubin: 0.7 mg/dL (ref 0.2–1.2)
Total Protein: 8 g/dL (ref 6.0–8.3)

## 2022-09-26 LAB — CBC
HCT: 43.9 % (ref 39.0–52.0)
Hemoglobin: 14.8 g/dL (ref 13.0–17.0)
MCHC: 33.8 g/dL (ref 30.0–36.0)
MCV: 90.2 fl (ref 78.0–100.0)
Platelets: 233 10*3/uL (ref 150.0–400.0)
RBC: 4.87 Mil/uL (ref 4.22–5.81)
RDW: 13.5 % (ref 11.5–15.5)
WBC: 3.3 10*3/uL — ABNORMAL LOW (ref 4.0–10.5)

## 2022-09-26 NOTE — Assessment & Plan Note (Signed)
With ongoing intermittent dizziness, will check an MRI of his brain.  See also plan for seizure-like activity.

## 2022-09-26 NOTE — Assessment & Plan Note (Signed)
He went to the ER on 09/12/2022 for seizure-like activity with his eyes rolling in the back of his head, confusion, and dizziness.  He states that he had a EEG done with neurology that was normal.  BMP, CBC reviewed from the ER visit.  With ongoing symptoms, will check a MRI of his brain, recheck CMP, CBC, and also check HIV and syphilis.  The symptoms are occurring at times when he is stressed, however sometimes it happens when he is not stressed.  Consider referral to psychiatry if imaging and labs are normal.  We will continue keep him out of work until 10/16/2022 and further testing results.

## 2022-09-26 NOTE — Progress Notes (Signed)
Established Patient Office Visit  Subjective   Patient ID: KOA ZOELLER, male    DOB: [birth year]  Age: 31 y.o. MRN: [id number]  Chief Complaint  Patient presents with   Follow-up    Hosp f/u on recent seizures, f/u on FMLA paperwork.     HPI  Mitchell Vega is here to follow-up after an ER visit for seizure-like activity. He states that on 09/12/22 he was trying to read a text message and unable to comprehend it and started feeling dizzy. He went to get up and walk it off and he fell. He got his wife and they called 911. He was states that he was very sensitive to light when EMS shined it in his eyes. Over the night in the ER, he noticed his eyes were rolling back in his head.   He is still experiencing times when his eyes are rolling back in his head. He feels dizzy at times and not in control of his movements. He endorses headaches with the dizziness, behind his eyes or in the back of his head. He saw neurology on 09/19/22 and had an EEG which was normal. He was told that these symptoms could be related to stress/anxiety.     ROS See pertinent positives and negatives per HPI.    Objective:     BP 102/68   Pulse 100   Temp 97.9 F (36.6 C) (Temporal)   Wt 135 lb (61.2 kg)   SpO2 97%   BMI 22.12 kg/m    Physical Exam Vitals and nursing note reviewed.  Constitutional:      Appearance: Normal appearance.  HENT:     Head: Normocephalic.     Right Ear: Tympanic membrane, ear canal and external ear normal.     Left Ear: Tympanic membrane, ear canal and external ear normal.  Eyes:     Conjunctiva/sclera: Conjunctivae normal.  Cardiovascular:     Rate and Rhythm: Normal rate and regular rhythm.     Pulses: Normal pulses.     Heart sounds: Normal heart sounds.  Pulmonary:     Effort: Pulmonary effort is normal.     Breath sounds: Normal breath sounds.  Musculoskeletal:     Cervical back: Normal range of motion.  Skin:    General: Skin is warm.  Neurological:      General: No focal deficit present.     Mental Status: He is alert and oriented to person, place, and time.     Cranial Nerves: No cranial nerve deficit.     Motor: No weakness.     Coordination: Coordination normal.     Gait: Gait normal.  Psychiatric:        Mood and Affect: Mood normal.        Behavior: Behavior normal.        Thought Content: Thought content normal.        Judgment: Judgment normal.      Assessment & Plan:   Problem List Items Addressed This Visit       Other   Seizure-like activity (HCC) - Primary    He went to the ER on 09/12/2022 for seizure-like activity with his eyes rolling in the back of his head, confusion, and dizziness.  He states that he had a EEG done with neurology that was normal.  BMP, CBC reviewed from the ER visit.  With ongoing symptoms, will check a MRI of his brain, recheck CMP, CBC, and also check HIV and syphilis.  The symptoms are occurring at times when he is stressed, however sometimes it happens when he is not stressed.  Consider referral to psychiatry if imaging and labs are normal.  We will continue keep him out of work until 10/16/2022 and further testing results.      Relevant Orders   CBC   Comprehensive metabolic panel   RPR   HIV Antibody (routine testing w rflx)   MR Brain W Wo Contrast   Dizzy    With ongoing intermittent dizziness, will check an MRI of his brain.  See also plan for seizure-like activity.      Relevant Orders   CBC   Comprehensive metabolic panel   RPR   HIV Antibody (routine testing w rflx)   MR Brain W Wo Contrast   Other Visit Diagnoses     Encounter for hepatitis C screening test for low risk patient       Screen for hepatitis C   Relevant Orders   Hepatitis C antibody       Return in about 1 week (around 10/03/2022) for 1-2 weeks after MRI.   A total of 30 minutes were spent on this encounter today. When total time is documented, this includes both the face-to-face and non-face-to-face  time personally spent before, during and after the visit on the date of the encounter. I reviewed notes, labs, and images from the ER and neurology. Discussed with him possible causes of symptoms.    Charyl Dancer, NP

## 2022-09-26 NOTE — Patient Instructions (Addendum)
It was great to see you!  We are checking your labs today and will let you know the results via mychart/phone.   I have ordered a MRI of your head. They will call to schedule.   Make sure you are eating regularly and drinking plenty of fluids.   Let's follow-up in 1-2 weeks, sooner if you have concerns.  If a referral was placed today, you will be contacted for an appointment. Please note that routine referrals can sometimes take up to 3-4 weeks to process. Please call our office if you haven't heard anything after this time frame.  Take care,  Vance Peper, NP

## 2022-09-27 LAB — HIV ANTIBODY (ROUTINE TESTING W REFLEX): HIV 1&2 Ab, 4th Generation: NONREACTIVE

## 2022-09-27 LAB — HEPATITIS C ANTIBODY: Hepatitis C Ab: NONREACTIVE

## 2022-09-27 LAB — RPR: RPR Ser Ql: NONREACTIVE

## 2022-10-10 ENCOUNTER — Ambulatory Visit
Admission: RE | Admit: 2022-10-10 | Discharge: 2022-10-10 | Disposition: A | Discharge status: Home / Self Care | Payer: 59 | Source: Ambulatory Visit | Attending: Nurse Practitioner | Admitting: Nurse Practitioner

## 2022-10-10 DIAGNOSIS — R569 Unspecified convulsions: Secondary | ICD-10-CM | POA: Insufficient documentation

## 2022-10-10 DIAGNOSIS — R42 Dizziness and giddiness: Secondary | ICD-10-CM | POA: Diagnosis present

## 2022-10-10 MED ORDER — GADOBUTROL 1 MMOL/ML IV SOLN
6.0000 mL | Freq: Once | INTRAVENOUS | Status: AC | PRN
  Administered 2022-10-10: 6 mL via INTRAVENOUS

## 2022-10-15 ENCOUNTER — Encounter: Payer: Self-pay | Admitting: Nurse Practitioner

## 2022-10-15 ENCOUNTER — Ambulatory Visit (INDEPENDENT_AMBULATORY_CARE_PROVIDER_SITE_OTHER): Payer: 59 | Admitting: Nurse Practitioner

## 2022-10-15 VITALS — BP 106/78 | HR 95 | Wt 135.6 lb

## 2022-10-15 DIAGNOSIS — R569 Unspecified convulsions: Secondary | ICD-10-CM | POA: Diagnosis not present

## 2022-10-15 NOTE — Progress Notes (Signed)
Established Patient Office Visit  Subjective   Patient ID: Mitchell Vega, male    DOB: [birth year]  Age: 31 y.o. MRN: [id number]  Chief Complaint  Patient presents with   Follow-up    2 wk f/u MRI, seizure-like activity    HPI  Mitchell Vega is here to follow-up on seizure-like activity.  He states that this last week, he had 3 episodes.  He describes these episodes of muscle tightening and contracting and it made him fall out of the bed at one point.  He was unable to walk and move again until these contractions had stopped.  He was fully conscious during these episodes.  He states that his wife had him do some deep breathing exercises and that had helped.  He states that it another point, he developed a headache and felt like his eyes were bulging out of his head.  He denies chest pain, shortness of breath.  He denies any new medications, supplements.    ROS See pertinent positives and negatives per HPI.    Objective:     BP 106/78   Pulse 95   Wt 135 lb 9.6 oz (61.5 kg)   SpO2 96%   BMI 22.22 kg/m  BP Readings from Last 3 Encounters:  10/15/22 106/78  09/26/22 102/68  09/19/22 122/71   Wt Readings from Last 3 Encounters:  10/15/22 135 lb 9.6 oz (61.5 kg)  09/26/22 135 lb (61.2 kg)  09/19/22 133 lb 12.8 oz (60.7 kg)      Physical Exam Vitals and nursing note reviewed.  Constitutional:      Appearance: Normal appearance.  HENT:     Head: Normocephalic.  Eyes:     Conjunctiva/sclera: Conjunctivae normal.  Cardiovascular:     Rate and Rhythm: Normal rate and regular rhythm.     Pulses: Normal pulses.     Heart sounds: Normal heart sounds.  Pulmonary:     Effort: Pulmonary effort is normal.     Breath sounds: Normal breath sounds.  Musculoskeletal:     Cervical back: Normal range of motion.  Skin:    General: Skin is warm.  Neurological:     General: No focal deficit present.     Mental Status: He is alert and oriented to person, place, and time.      Cranial Nerves: No cranial nerve deficit.     Motor: No weakness.     Coordination: Coordination normal.     Gait: Gait normal.  Psychiatric:        Mood and Affect: Mood normal.        Behavior: Behavior normal.        Thought Content: Thought content normal.        Judgment: Judgment normal.       Assessment & Plan:   Problem List Items Addressed This Visit       Other   Seizure-like activity ([place]) - Primary    He has been having ongoing seizure-like activity that is intermittent.  He states that more recently he has been having severe muscle contractions that cause him to be unable to move, however he is conscious during these episodes.  Less likely seizure since he does have full consciousness.  MRI done which was negative for any acute findings.  With new muscle contractions, will check CMP, magnesium, CK, ESR, CRP, ANA.  Neurology also recommended a referral to psychiatry if testing was negative, referral placed to psychiatry today.  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  We will extend his leave of absence for another month.  Discussed case with Dr. Gena Fray. Follow-up in 4 to 6 weeks.      Relevant Orders   Comprehensive metabolic panel   Magnesium   ANA w/Reflex   Ambulatory referral to Psychiatry   CK (Creatine Kinase)   C-reactive protein   Sedimentation rate    Return in about 4 weeks (around 11/12/2022) for 1-2 months seizure like activity .    Charyl Dancer, NP

## 2022-10-15 NOTE — Assessment & Plan Note (Addendum)
He has been having ongoing seizure-like activity that is intermittent.  He states that more recently he has been having severe muscle contractions that cause him to be unable to move, however he is conscious during these episodes.  Less likely seizure since he does have full consciousness.  MRI done which was negative for any acute findings.  With new muscle contractions, will check CMP, magnesium, CK, ESR, CRP, ANA.  Neurology also recommended a referral to psychiatry if testing was negative, referral placed to psychiatry today.  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  We will extend his leave of absence for another month.  Discussed case with Dr. Gena Fray. Follow-up in 4 to 6 weeks.

## 2022-10-15 NOTE — Patient Instructions (Addendum)
It was great to see you!  You can try an over the counter vitamin B12 supplement to see if this helps with your symptoms.   You can reach out to your neurologist to let them know about the muscle spasms and cramping as well.   We are checking your labs today and will let you know the results via mychart/phone.   I have placed a referral to psychiatry.   Let's follow-up in 1-2 months, sooner if you have concerns.  If a referral was placed today, you will be contacted for an appointment. Please note that routine referrals can sometimes take up to 3-4 weeks to process. Please call our office if you haven't heard anything after this time frame.  Take care,  Vance Peper, NP

## 2022-10-16 LAB — COMPREHENSIVE METABOLIC PANEL
ALT: 9 U/L (ref 0–53)
AST: 14 U/L (ref 0–37)
Albumin: 4.7 g/dL (ref 3.5–5.2)
Alkaline Phosphatase: 50 U/L (ref 39–117)
BUN: 13 mg/dL (ref 6–23)
CO2: 28 mEq/L (ref 19–32)
Calcium: 9.5 mg/dL (ref 8.4–10.5)
Chloride: 101 mEq/L (ref 96–112)
Creatinine, Ser: 1.03 mg/dL (ref 0.40–1.50)
GFR: 97.07 mL/min (ref >60.00)
Glucose, Bld: 98 mg/dL (ref 70–99)
Potassium: 3.7 mEq/L (ref 3.5–5.1)
Sodium: 138 mEq/L (ref 135–145)
Total Bilirubin: 0.8 mg/dL (ref 0.2–1.2)
Total Protein: 7.8 g/dL (ref 6.0–8.3)

## 2022-10-16 LAB — SEDIMENTATION RATE: Sed Rate: 16 mm/hr — ABNORMAL HIGH (ref 0–15)

## 2022-10-16 LAB — ANA W/REFLEX: Anti Nuclear Antibody (ANA): NEGATIVE

## 2022-10-16 LAB — CK: Total CK: 116 U/L (ref 7–232)

## 2022-10-16 LAB — C-REACTIVE PROTEIN: CRP: <1 mg/dL (ref 0.5–20.0)

## 2022-10-16 LAB — MAGNESIUM: Magnesium: 2 mg/dL (ref 1.5–2.5)

## 2022-10-16 NOTE — Telephone Encounter (Signed)
Called and spoke to pt spouse, explain to pt wife we will need some type of form or paperwork that is requesting specific information needed for Long term disability. Spouse stated she will have pt f/u with employer for further details. Sw, cma

## 2022-10-18 NOTE — Telephone Encounter (Signed)
Paperwork received on 10/17/22  Type of paperwork: FMLA  Route received: Fax   Has patient completed their portion of the paperwork: N/A   Has office staff updated demographics of paperwork: YES   Paperwork routed to provider : Placed on provider desk 10/17/22.  TAT 5-7 bus days

## 2022-10-19 NOTE — Telephone Encounter (Signed)
Attempted fax FMLA form and clinical notes to [employer] leave [employer] company multiple times, fax failed each time. Reached out to [employer] and was given secured email address. Paperwork was scanned to email 10/19/22. Called pt and ldvm.

## 2023-01-08 ENCOUNTER — Encounter: Payer: Self-pay | Admitting: Neurology

## 2023-01-08 ENCOUNTER — Ambulatory Visit: Payer: 59 | Admitting: Neurology

## 2024-03-12 ENCOUNTER — Encounter: Payer: Self-pay | Admitting: Nurse Practitioner
